# Patient Record
Sex: Male | Born: 1985 | Race: Black or African American | Hispanic: No | Marital: Single | Smoking: Current every day smoker
Health system: Southern US, Community
[De-identification: ages and names within clinical notes are randomized; demographics above are authoritative.]

## PROBLEM LIST (undated history)

## (undated) DIAGNOSIS — I10 Essential (primary) hypertension: Secondary | ICD-10-CM

## (undated) DIAGNOSIS — E119 Type 2 diabetes mellitus without complications: Secondary | ICD-10-CM

---

## 2019-04-26 ENCOUNTER — Emergency Department (HOSPITAL_COMMUNITY)
Admission: EM | Admit: 2019-04-26 | Discharge: 2019-04-26 | Disposition: A | Discharge status: Home / Self Care | Payer: No Typology Code available for payment source | Attending: Emergency Medicine | Admitting: Emergency Medicine

## 2019-04-26 ENCOUNTER — Encounter (HOSPITAL_COMMUNITY): Payer: Self-pay

## 2019-04-26 ENCOUNTER — Emergency Department (HOSPITAL_COMMUNITY): Payer: No Typology Code available for payment source

## 2019-04-26 ENCOUNTER — Other Ambulatory Visit: Payer: Self-pay

## 2019-04-26 DIAGNOSIS — Z7984 Long term (current) use of oral hypoglycemic drugs: Secondary | ICD-10-CM | POA: Insufficient documentation

## 2019-04-26 DIAGNOSIS — I10 Essential (primary) hypertension: Secondary | ICD-10-CM | POA: Insufficient documentation

## 2019-04-26 DIAGNOSIS — M542 Cervicalgia: Secondary | ICD-10-CM | POA: Insufficient documentation

## 2019-04-26 DIAGNOSIS — Y999 Unspecified external cause status: Secondary | ICD-10-CM | POA: Diagnosis not present

## 2019-04-26 DIAGNOSIS — E119 Type 2 diabetes mellitus without complications: Secondary | ICD-10-CM | POA: Diagnosis not present

## 2019-04-26 DIAGNOSIS — S39012A Strain of muscle, fascia and tendon of lower back, initial encounter: Secondary | ICD-10-CM | POA: Diagnosis not present

## 2019-04-26 DIAGNOSIS — Z79899 Other long term (current) drug therapy: Secondary | ICD-10-CM | POA: Insufficient documentation

## 2019-04-26 DIAGNOSIS — M79604 Pain in right leg: Secondary | ICD-10-CM | POA: Diagnosis not present

## 2019-04-26 DIAGNOSIS — Y92410 Unspecified street and highway as the place of occurrence of the external cause: Secondary | ICD-10-CM | POA: Insufficient documentation

## 2019-04-26 DIAGNOSIS — F17210 Nicotine dependence, cigarettes, uncomplicated: Secondary | ICD-10-CM | POA: Diagnosis not present

## 2019-04-26 DIAGNOSIS — Y9389 Activity, other specified: Secondary | ICD-10-CM | POA: Diagnosis not present

## 2019-04-26 DIAGNOSIS — S3992XA Unspecified injury of lower back, initial encounter: Secondary | ICD-10-CM | POA: Diagnosis present

## 2019-04-26 HISTORY — DX: Essential (primary) hypertension: I10

## 2019-04-26 HISTORY — DX: Type 2 diabetes mellitus without complications: E11.9

## 2019-04-26 MED ORDER — HYDROCHLOROTHIAZIDE 12.5 MG PO CAPS
25.0000 mg | ORAL_CAPSULE | Freq: Once | ORAL | Status: AC
  Administered 2019-04-26: 25 mg via ORAL
  Filled 2019-04-26: qty 2

## 2019-04-26 MED ORDER — IBUPROFEN 800 MG PO TABS
800.0000 mg | ORAL_TABLET | Freq: Once | ORAL | Status: AC
  Administered 2019-04-26: 800 mg via ORAL
  Filled 2019-04-26: qty 1

## 2019-04-26 MED ORDER — CYCLOBENZAPRINE HCL 10 MG PO TABS
5.0000 mg | ORAL_TABLET | Freq: Once | ORAL | Status: AC
  Administered 2019-04-26: 5 mg via ORAL
  Filled 2019-04-26: qty 1

## 2019-04-26 MED ORDER — IBUPROFEN 800 MG PO TABS: 800.0000 mg | ORAL_TABLET | Freq: Three times a day (TID) | ORAL | 0 refills | Status: AC

## 2019-04-26 MED ORDER — CYCLOBENZAPRINE HCL 10 MG PO TABS: 10.0000 mg | ORAL_TABLET | Freq: Two times a day (BID) | ORAL | 0 refills | Status: AC | PRN

## 2019-04-26 MED ORDER — AMLODIPINE BESYLATE 5 MG PO TABS
10.0000 mg | ORAL_TABLET | Freq: Once | ORAL | Status: AC
  Administered 2019-04-26: 10 mg via ORAL
  Filled 2019-04-26: qty 2
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
## 2019-04-26 NOTE — Discharge Instructions (Addendum)
Take motrin for pain   Take flexeril for severe pain   Take your blood pressure medicines as prescribed   See your doctor in a week to recheck blood pressure   Return to ER if you have worse back pain, weakness, numbness, headaches, blurry vision

## 2019-04-26 NOTE — ED Notes (Signed)
Patient transported to X-ray 
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
